# Patient Record
Sex: Female | Born: 1975 | Race: White | Hispanic: No | Marital: Married | Smoking: Never smoker
Health system: Southern US, Community
[De-identification: ages and names within clinical notes are randomized; demographics above are authoritative.]

---

## 2000-06-27 ENCOUNTER — Emergency Department (HOSPITAL_COMMUNITY): Admission: EM | Admit: 2000-06-27 | Discharge: 2000-06-27 | Payer: Self-pay | Admitting: Emergency Medicine

## 2001-03-22 ENCOUNTER — Emergency Department (HOSPITAL_COMMUNITY): Admission: EM | Admit: 2001-03-22 | Discharge: 2001-03-22 | Payer: Self-pay | Admitting: Emergency Medicine

## 2002-02-03 ENCOUNTER — Emergency Department (HOSPITAL_COMMUNITY): Admission: EM | Admit: 2002-02-03 | Discharge: 2002-02-03 | Payer: Self-pay | Admitting: Emergency Medicine

## 2003-08-13 ENCOUNTER — Other Ambulatory Visit: Admission: RE | Admit: 2003-08-13 | Discharge: 2003-08-13 | Payer: Self-pay | Admitting: Obstetrics and Gynecology

## 2004-03-22 ENCOUNTER — Emergency Department (HOSPITAL_COMMUNITY): Admission: EM | Admit: 2004-03-22 | Discharge: 2004-03-22 | Payer: Self-pay | Admitting: Emergency Medicine

## 2020-05-22 ENCOUNTER — Encounter: Payer: Self-pay | Admitting: Internal Medicine

## 2020-05-22 ENCOUNTER — Other Ambulatory Visit: Payer: Self-pay

## 2020-05-22 ENCOUNTER — Ambulatory Visit (INDEPENDENT_AMBULATORY_CARE_PROVIDER_SITE_OTHER): Payer: 59 | Admitting: Internal Medicine

## 2020-05-22 VITALS — BP 98/62 | HR 94 | Temp 98.2°F | Ht 62.0 in | Wt 114.0 lb

## 2020-05-22 DIAGNOSIS — Z289 Immunization not carried out for unspecified reason: Secondary | ICD-10-CM

## 2020-05-22 NOTE — Progress Notes (Signed)
    New Patient Office Visit     This visit occurred during the SARS-CoV-2 public health emergency.  Safety protocols were in place, including screening questions prior to the visit, additional usage of staff PPE, and extensive cleaning of exam room while observing appropriate contact time as indicated for disinfecting solutions.    CC/Reason for Visit: Discuss concerns about Covid vaccination Previous PCP: None Last Visit: Unknown  HPI: Kimberly Huber is a 44 y.o. female who is coming in today for the above mentioned reasons.  She has no past medical history of significance.  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  She has no acute complaints today other than significant concerns about Covid vaccination.  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  Significant anxiety regarding this decision and possibility of losing her employment.   Past Medical/Surgical History: History reviewed. No pertinent past medical history.  History reviewed. No pertinent surgical history.  Social History:  reports that she has never smoked. She has never used smokeless tobacco. She reports that she does not drink alcohol and does not use drugs.  Allergies: No Known Allergies  Family History:  History reviewed. No pertinent family history.  No current outpatient medications on file.  Review of Systems:  Constitutional: Denies fever, chills, diaphoresis, appetite change and fatigue.  HEENT: Denies photophobia, eye pain, redness, hearing loss, ear pain, congestion, sore throat, rhinorrhea, sneezing, mouth sores, trouble swallowing, neck pain, neck stiffness and tinnitus.   Respiratory: Denies SOB, DOE, cough, chest tightness,  and wheezing.   Cardiovascular: Denies chest pain, palpitations and leg swelling.  Gastrointestinal: Denies nausea, vomiting, abdominal pain, diarrhea, constipation, blood in  stool and abdominal distention.  Genitourinary: Denies dysuria, urgency, frequency, hematuria, flank pain and difficulty urinating.  Endocrine: Denies: hot or cold intolerance, sweats, changes in hair or nails, polyuria, polydipsia. Musculoskeletal: Denies myalgias, back pain, joint swelling, arthralgias and gait problem.  Skin: Denies pallor, rash and wound.  Neurological: Denies dizziness, seizures, syncope, weakness, light-headedness, numbness and headaches.  Hematological: Denies adenopathy. Easy bruising, personal or family bleeding history  Psychiatric/Behavioral: Denies suicidal ideation, mood changes, confusion, nervousness, sleep disturbance and agitation    Physical Exam: Vitals:   05/22/20 1458  BP: (!) 98/62  Pulse: 94  Temp: 98.2 F (36.8 C)  TempSrc: Oral  SpO2: 97%  Weight: 114 lb (51.7 kg)  Height: 5\' 2"  (1.575 m)   Body mass index is 20.85 kg/m.   Constitutional: NAD, calm, comfortable Eyes: PERRL, lids and conjunctivae normal ENMT: Mucous membranes are moist.  Psychiatric: Normal judgment and insight. Alert and oriented x 3. Normal mood.    Impression and Plan:  COVID-19 virus vaccination not done -Have spent the totality of our visit today discussing the Covid vaccine.  She has multiple concerns and questions that I have tried to assuage and answer for her. -She states our conversation has been helpful and she will continue to debate on whether or not she will get the vaccine.  She will schedule return visit for her physical.  Time spent: 30 minutes       Mitsuye Schrodt , MD [facility]

## 2020-06-13 ENCOUNTER — Other Ambulatory Visit: Payer: Self-pay | Admitting: Internal Medicine

## 2020-06-13 DIAGNOSIS — Z1231 Encounter for screening mammogram for malignant neoplasm of breast: Secondary | ICD-10-CM

## 2020-06-18 ENCOUNTER — Ambulatory Visit
Admission: RE | Admit: 2020-06-18 | Discharge: 2020-06-18 | Disposition: A | Discharge status: Home / Self Care | Payer: 59 | Source: Ambulatory Visit | Attending: Internal Medicine | Admitting: Internal Medicine

## 2020-06-18 ENCOUNTER — Other Ambulatory Visit: Payer: Self-pay

## 2020-06-18 DIAGNOSIS — Z1231 Encounter for screening mammogram for malignant neoplasm of breast: Secondary | ICD-10-CM

## 2020-07-23 ENCOUNTER — Encounter: Payer: 59 | Admitting: Internal Medicine

## 2020-08-26 IMAGING — MG DIGITAL SCREENING BILAT W/ CAD
4 series · 4 of 4 positions shown · non-contrast
Comparison: None.

CLINICAL DATA: Screening.

EXAM:
DIGITAL SCREENING BILATERAL MAMMOGRAM WITH CAD

[L MLO]
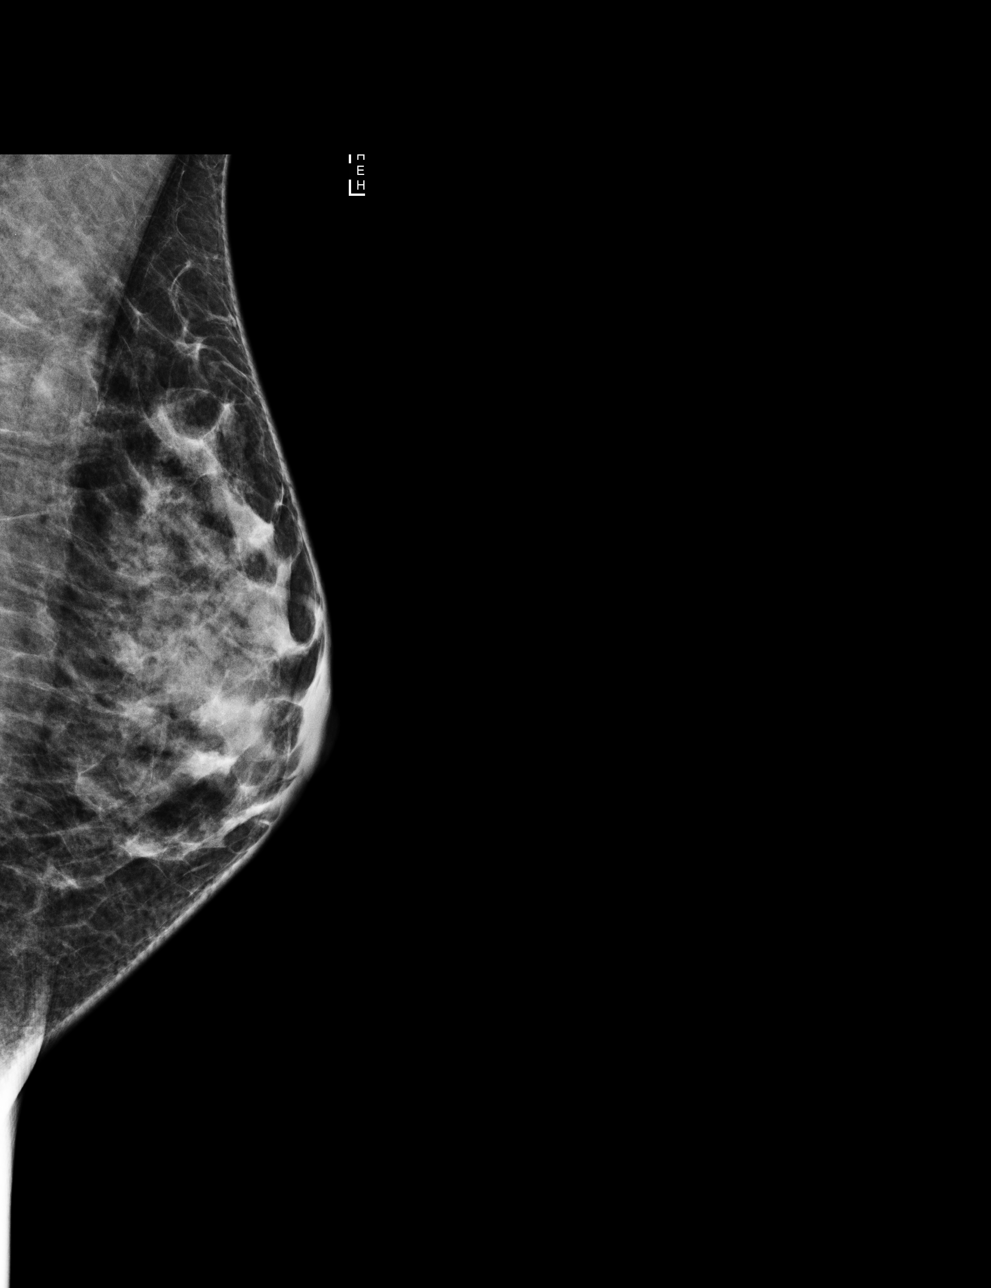

[L CC]
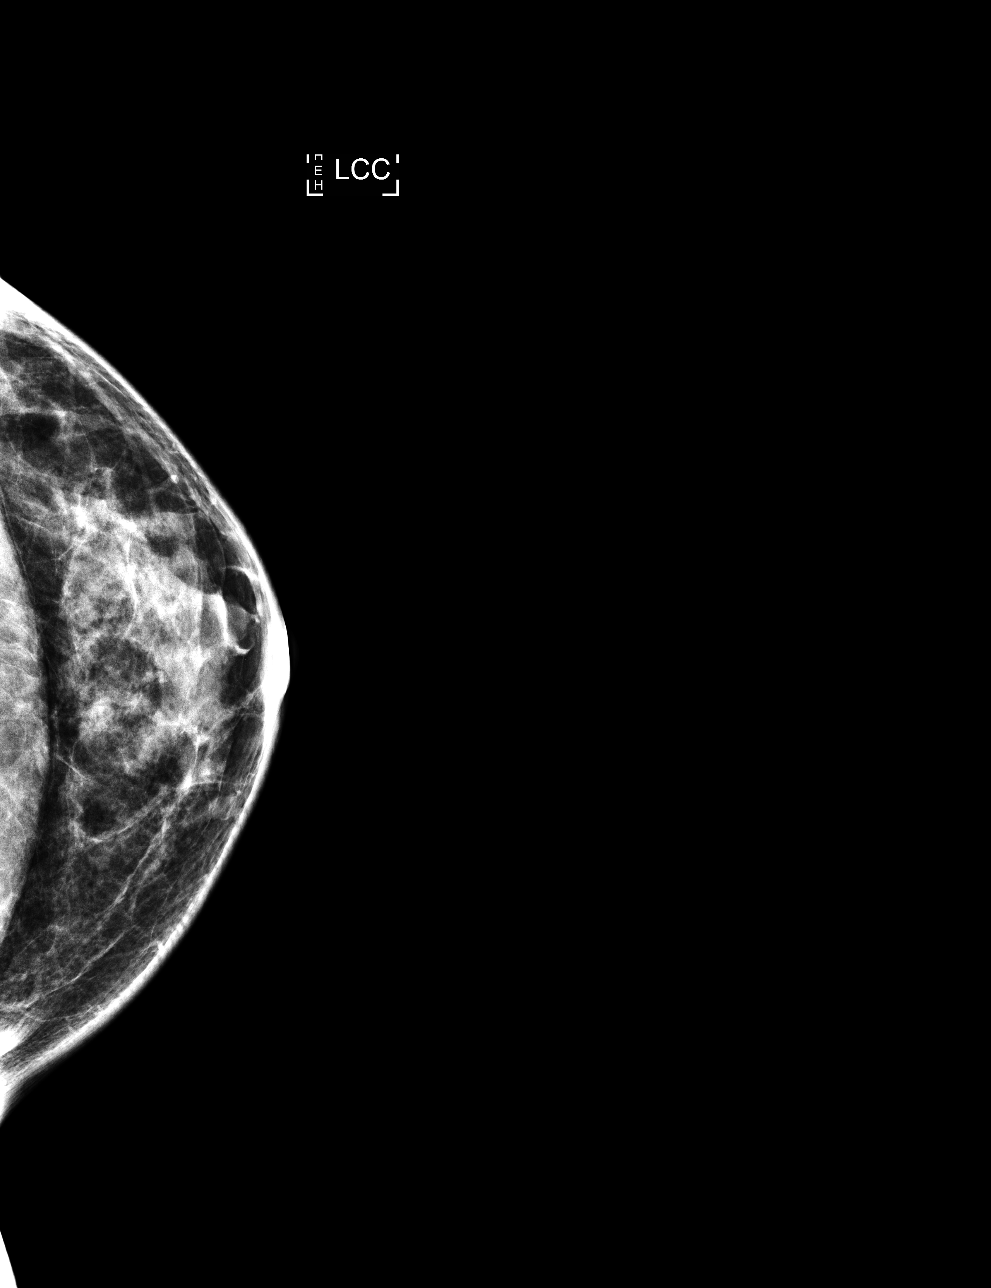

[R MLO]
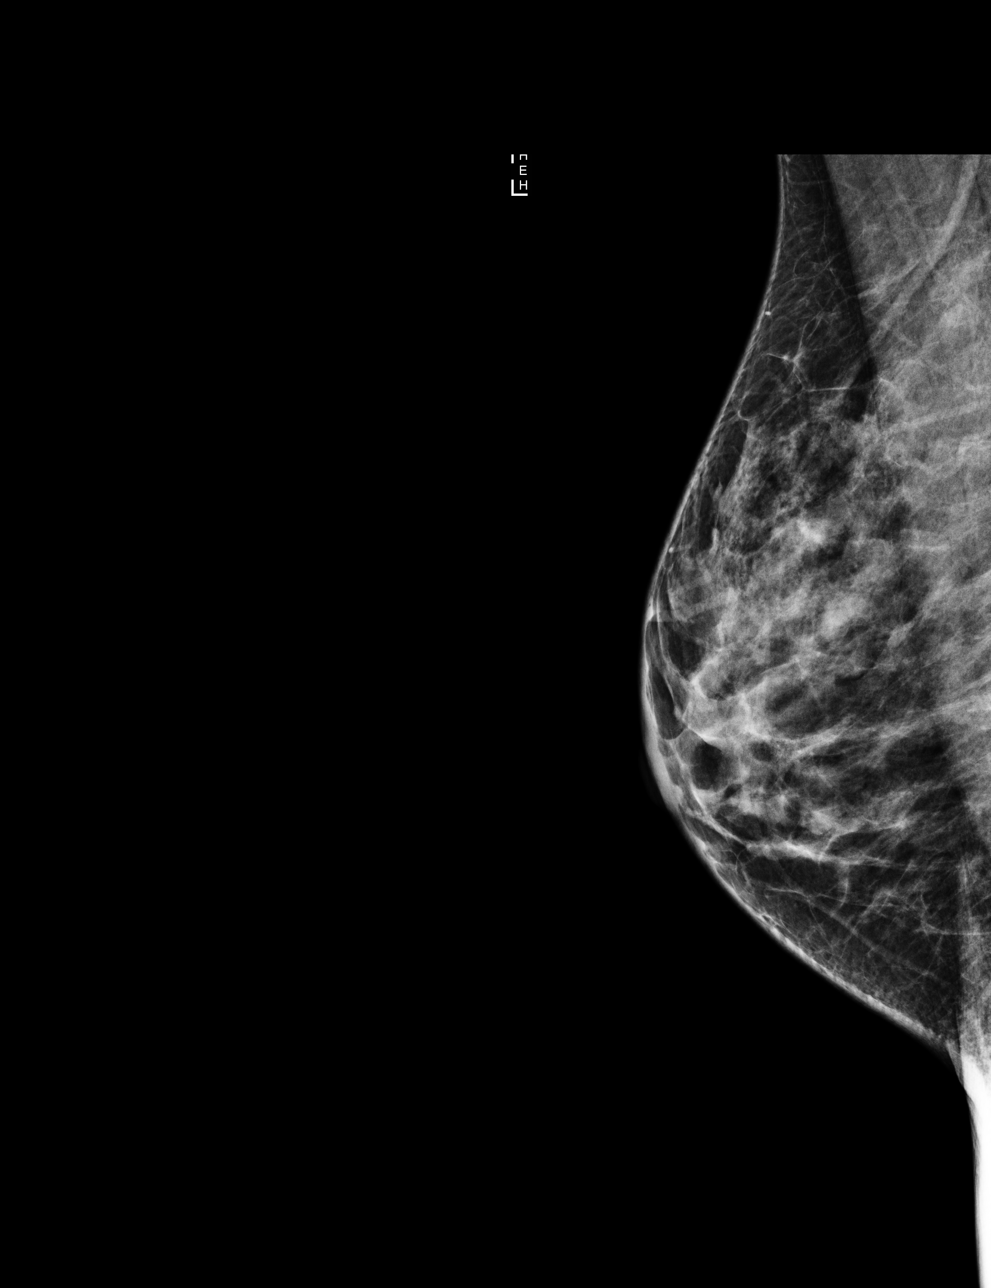

[R CC]
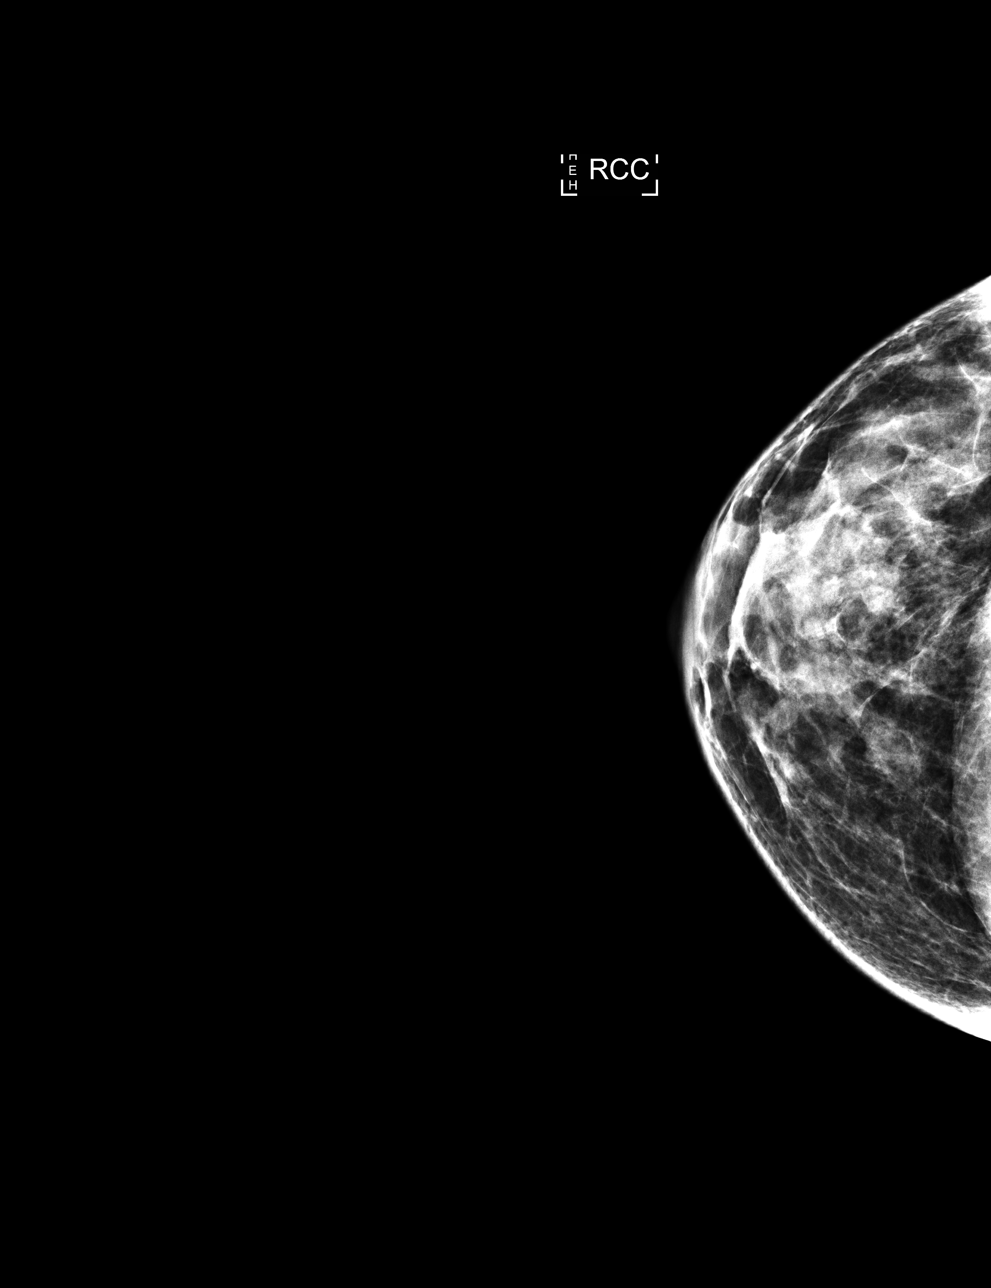

[4 of 4 positions shown; findings below may reference images not displayed]

ACR Breast Density Category c: The breast tissue is heterogeneously
dense, which may obscure small masses
FINDINGS: There are no findings suspicious for malignancy. Images were
processed with CAD.
IMPRESSION: No mammographic evidence of malignancy. A result letter of this
screening mammogram will be mailed directly to the patient.

RECOMMENDATION:
Screening mammogram in one year. (Code:U2-0-761)

BI-RADS CATEGORY  1: Negative.
